# Patient Record
Sex: Female | Born: 1975 | Race: White | Hispanic: No | Marital: Single | Smoking: Current every day smoker
Health system: Southern US, Community
[De-identification: ages and names within clinical notes are randomized; demographics above are authoritative.]

## PROBLEM LIST (undated history)

## (undated) DIAGNOSIS — E0500 Thyrotoxicosis with diffuse goiter without thyrotoxic crisis or storm: Secondary | ICD-10-CM

## (undated) HISTORY — PX: OTHER SURGICAL HISTORY: SHX169

## (undated) HISTORY — PX: EYE SURGERY: SHX253

---

## 2021-06-02 ENCOUNTER — Emergency Department (INDEPENDENT_AMBULATORY_CARE_PROVIDER_SITE_OTHER): Payer: BC Managed Care – PPO

## 2021-06-02 ENCOUNTER — Emergency Department (INDEPENDENT_AMBULATORY_CARE_PROVIDER_SITE_OTHER)
Admission: EM | Admit: 2021-06-02 | Discharge: 2021-06-02 | Disposition: A | Discharge status: Home / Self Care | Payer: BC Managed Care – PPO | Source: Home / Self Care | Attending: Family Medicine | Admitting: Family Medicine

## 2021-06-02 ENCOUNTER — Encounter: Payer: Self-pay | Admitting: Emergency Medicine

## 2021-06-02 ENCOUNTER — Other Ambulatory Visit: Payer: Self-pay

## 2021-06-02 DIAGNOSIS — J40 Bronchitis, not specified as acute or chronic: Secondary | ICD-10-CM

## 2021-06-02 DIAGNOSIS — R0602 Shortness of breath: Secondary | ICD-10-CM

## 2021-06-02 HISTORY — DX: Thyrotoxicosis with diffuse goiter without thyrotoxic crisis or storm: E05.00

## 2021-06-02 MED ORDER — LEVOFLOXACIN 500 MG PO TABS: 500.0000 mg | ORAL_TABLET | Freq: Every day | ORAL | 0 refills | Status: DC

## 2021-06-02 MED ORDER — PREDNISONE 20 MG PO TABS: 20.0000 mg | ORAL_TABLET | Freq: Two times a day (BID) | ORAL | 0 refills | Status: DC

## 2021-06-02 NOTE — Discharge Instructions (Signed)
Take 2 prednisone a day for 5 days Take the antibiotic once a day for 7 days Continue to drink lots of water Run a humidifier in your bedroom if you have 1 available See your doctor if not improving by next week

## 2021-06-02 NOTE — ED Triage Notes (Addendum)
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 Her PCP prescribed prednisone and cough syrup from 1-19, but did not get completely better, now cough is worse, SOB x 4 days HX Pneumonia

## 2021-06-02 NOTE — ED Provider Notes (Signed)
[facility]    CSN: [id number] Arrival date & time: 06/02/21  1237      History   Chief Complaint Chief Complaint  Patient presents with   Shortness of Breath    HPI Olivia Mitchell is a 46 y.o. female.   HPI  Patient's had a cough for about 3 weeks.  She saw her primary care doctor in January.  She was given prednisone and cough medicine.  She continues to cough in spite of this.  She does have a history of spontaneous pneumothorax.  She had partial removal of her left lung 14 years ago.  She continues to smoke cigarettes.  She states that she has some chest pain with coughing, feels short of breath, she has pain with deep breath.  No fever or chills  Past Medical History:  Diagnosis Date   Graves disease     There are no problems to display for this patient.   Past Surgical History:  Procedure Laterality Date   EYE SURGERY     tubaligation      OB History   No obstetric history on file.      Home Medications    Prior to Admission medications   Medication Sig Start Date End Date Taking? Authorizing Provider  clindamycin (CLEOCIN T) 1 % external solution Apply topically 2 (two) times daily.   Yes [provider]  diltiazem (CARDIZEM) 120 MG tablet Take 120 mg by mouth 4 (four) times daily.   Yes [provider]  levofloxacin (LEVAQUIN) 500 MG tablet Take 1 tablet (500 mg total) by mouth daily. 06/02/21  Yes Eustace Moore, MD  levothyroxine (SYNTHROID) 125 MCG tablet Take 125 mcg by mouth daily before breakfast.   Yes [provider]  predniSONE (DELTASONE) 20 MG tablet Take 1 tablet (20 mg total) by mouth 2 (two) times daily with a meal. 06/02/21  Yes Eustace Moore, MD    Family History Family History  Problem Relation Age of Onset   Thyroid disease Mother    Neuropathy Mother    Hypertension Mother    Asthma Father     Social History Social History   Tobacco Use   Smoking status: Every Day    Packs/day:  0.50    Types: Cigarettes   Smokeless tobacco: Never  Vaping Use   Vaping Use: Never used  Substance Use Topics   Alcohol use: Not Currently   Drug use: Not Currently     Allergies   Patient has no known allergies.   Review of Systems Review of Systems See HPI  Physical Exam Triage Vital Signs ED Triage Vitals  Enc Vitals Group     BP 06/02/21 1257 116/79     Pulse Rate 06/02/21 1257 96     Resp 06/02/21 1257 18     Temp 06/02/21 1257 98.2 F (36.8 C)     Temp Source 06/02/21 1257 Oral     SpO2 06/02/21 1257 93 %     Weight 06/02/21 1258 135 lb (61.2 kg)     Height 06/02/21 1258 5\' 6"  (1.676 m)     Head Circumference --      Peak Flow --      Pain Score 06/02/21 1258 0     Pain Loc --      Pain Edu? --      Excl. in GC? --    No data found.  Updated Vital Signs BP 116/79 (BP Location: Left Arm)  Pulse 96    Temp 98.2 F (36.8 C) (Oral)    Resp 18    Ht 5\' 6"  (1.676 m)    Wt 61.2 kg    LMP 05/10/2021 (Exact Date)    SpO2 93%    BMI 21.79 kg/m        Physical Exam Constitutional:      General: She is not in acute distress.    Appearance: She is well-developed and normal weight.  HENT:     Head: Normocephalic and atraumatic.     Right Ear: Tympanic membrane and ear canal normal.     Left Ear: Tympanic membrane and ear canal normal.     Nose: Nose normal.  Eyes:     Conjunctiva/sclera: Conjunctivae normal.     Pupils: Pupils are equal, round, and reactive to light.  Cardiovascular:     Rate and Rhythm: Normal rate and regular rhythm.  Pulmonary:     Effort: Pulmonary effort is normal. No respiratory distress.     Comments: Patient takes shallow breaths.  Coughs with deep breath.  No wheezing rales or rhonchi identified Abdominal:     General: There is no distension.     Palpations: Abdomen is soft.  Musculoskeletal:        General: Normal range of motion.     Cervical back: Normal range of motion.  Lymphadenopathy:     Cervical: No cervical  adenopathy.  Skin:    General: Skin is warm and dry.  Neurological:     Mental Status: She is alert.  Psychiatric:        Mood and Affect: Mood normal.        Behavior: Behavior normal.     UC Treatments / Results  Labs (all labs ordered are listed, but only abnormal results are displayed) Labs Reviewed - No data to display  EKG   Radiology DG Chest 2 View  Result Date: 06/02/2021 CLINICAL DATA:  History of pneumonia EXAM: CHEST - 2 VIEW COMPARISON:  None. FINDINGS: Cardiac and mediastinal contours are within normal limits. Apical pleuroparenchymal scarring. Elevation of the left hemidiaphragm and postsurgical changes of the left lung. Left lower lung linear and nodular opacities. Blunting of the right costophrenic angle which may be due to trace pleural effusion or pleural thickening. No evidence of pneumothorax. IMPRESSION: Left lower lung linear nodular opacities, possibly due to scarring or infection. Recommend CT for better evaluation. Electronically Signed   By: Allegra Lai M.D.   On: 06/02/2021 13:22    Procedures Procedures (including critical care time)  Medications Ordered in UC Medications - No data to display  Initial Impression / Assessment and Plan / UC Course  I have reviewed the triage vital signs and the nursing notes.  Pertinent labs & imaging results that were available during my care of the patient were reviewed by me and considered in my medical decision making The x-ray was reviewed with the patient.  She understands that there are some abnormalities are likely due to her prior lung injury.  No pneumonia is identified.  Told of her symptoms persisted important to get the CAT scan recommended Final Clinical Impressions(s) / UC Diagnoses   Final diagnoses:  Shortness of breath  Bronchitis     Discharge Instructions      Take 2 prednisone a day for 5 days Take the antibiotic once a day for 7 days Continue to drink lots of water Run a humidifier  in your bedroom if you have 1  available See your doctor if not improving by next week   ED Prescriptions     Medication Sig Dispense Auth. Provider   levofloxacin (LEVAQUIN) 500 MG tablet Take 1 tablet (500 mg total) by mouth daily. 7 tablet Eustace Moore, MD   predniSONE (DELTASONE) 20 MG tablet Take 1 tablet (20 mg total) by mouth 2 (two) times daily with a meal. 10 tablet Eustace Moore, MD      PDMP not reviewed this encounter.   Eustace Moore, MD 06/02/21 [phone]

## 2021-06-29 ENCOUNTER — Encounter (HOSPITAL_COMMUNITY): Payer: Self-pay | Admitting: Radiology

## 2021-08-07 ENCOUNTER — Emergency Department (INDEPENDENT_AMBULATORY_CARE_PROVIDER_SITE_OTHER): Payer: BC Managed Care – PPO

## 2021-08-07 ENCOUNTER — Emergency Department (INDEPENDENT_AMBULATORY_CARE_PROVIDER_SITE_OTHER)
Admission: EM | Admit: 2021-08-07 | Discharge: 2021-08-07 | Disposition: A | Discharge status: Home / Self Care | Payer: BC Managed Care – PPO | Source: Home / Self Care | Attending: Family Medicine | Admitting: Family Medicine

## 2021-08-07 ENCOUNTER — Encounter: Payer: Self-pay | Admitting: Emergency Medicine

## 2021-08-07 DIAGNOSIS — M5442 Lumbago with sciatica, left side: Secondary | ICD-10-CM

## 2021-08-07 DIAGNOSIS — M545 Low back pain, unspecified: Secondary | ICD-10-CM

## 2021-08-07 DIAGNOSIS — S39012A Strain of muscle, fascia and tendon of lower back, initial encounter: Secondary | ICD-10-CM | POA: Diagnosis not present

## 2021-08-07 DIAGNOSIS — M5416 Radiculopathy, lumbar region: Secondary | ICD-10-CM

## 2021-08-07 MED ORDER — CYCLOBENZAPRINE HCL 5 MG PO TABS: 5.0000 mg | ORAL_TABLET | Freq: Three times a day (TID) | ORAL | 0 refills | Status: DC | PRN

## 2021-08-07 MED ORDER — METHYLPREDNISOLONE 4 MG PO TBPK: ORAL_TABLET | ORAL | 0 refills | Status: DC

## 2021-08-07 MED ORDER — HYDROCODONE-ACETAMINOPHEN 7.5-325 MG PO TABS: 1.0000 | ORAL_TABLET | Freq: Four times a day (QID) | ORAL | 0 refills | Status: DC | PRN

## 2021-08-07 MED ORDER — KETOROLAC TROMETHAMINE 60 MG/2ML IM SOLN
60.0000 mg | Freq: Once | INTRAMUSCULAR | Status: AC
  Administered 2021-08-07: 60 mg via INTRAMUSCULAR

## 2021-08-07 MED ORDER — ACETAMINOPHEN 500 MG PO TABS
1000.0000 mg | ORAL_TABLET | Freq: Once | ORAL | Status: AC
  Administered 2021-08-07: 1000 mg via ORAL

## 2021-08-07 NOTE — ED Triage Notes (Addendum)
Patient presents to Urgent Care with complaints of right back pain radiating down right leg since 3 days ago. Patient reports the pain is severe. Compared to four kids this is bad. Tried Ibuprofen. Has been able to walk but slow. Having shooting pain that comes and goes and constant dull pain. No history of back pain previous ?Marland Kitchen   ?

## 2021-08-07 NOTE — ED Notes (Signed)
Pt's pharmacy in [place] does not have Cobb - All scripts changed to [facility] in [place] by Dr Meda Coffee. Pt to call back tomorrow if she is not able to get Norco filled  ?

## 2021-08-07 NOTE — Discharge Instructions (Addendum)
X-rays do not show any significant degenerative disc disease or alignment problem ?You do have symptoms consistent with a nerve inflammation (left leg pain) ?This is treated with steroids.  I am giving you a Dosepak.  Take all of day 1 today ?In addition I am giving you pain medication and muscle relaxers to take as needed ?Activity as tolerated.  Try to reduce your activity to avoid bending and lifting movements ?See your doctor if not improving by next week ?

## 2021-08-07 NOTE — ED Provider Notes (Addendum)
?[facility] ? ? ? ?CSN: [id number] ?Arrival date & time: 08/07/21  1424 ? ? ?  ? ?History   ?Chief Complaint ?Chief Complaint  ?Patient presents with  ? Back Pain  ? ? ?HPI ?Olivia Mitchell is a 46 y.o. female.  ? ?HPI ? ?Patient is here for back pain.  She states she is never had back pain this severe.  She has pain in her right low back that radiates down through her buttock to the shin of her right leg.  It is worse with movement.  She states that she has tried to continue with her normal activity by taking over-the-counter medications and being stubborn.  Today she states the pain is so severe she can hardly move. ?She does have a history of back pain in the past but she states that it was different.  She was seen by Dr. Laurian Brim'Toole in the spine department for injections and pain management.  She had pain in the right low back.  Her lumbar spine films at that time were negative.  Uncertain if she had imaging.  Chart reviewed ?Is not currently under treatment for any back or orthopedic problems ? ?Past Medical History:  ?Diagnosis Date  ? Graves disease   ? ? ?There are no problems to display for this patient. ? ? ?Past Surgical History:  ?Procedure Laterality Date  ? EYE SURGERY    ? tubaligation    ? ? ?OB History   ?No obstetric history on file. ?  ? ? ? ?Home Medications   ? ?Prior to Admission medications   ?Medication Sig Start Date End Date Taking? Authorizing Provider  ?diltiazem (CARDIZEM) 120 MG tablet Take 120 mg by mouth 4 (four) times daily.   Yes [provider]  ?levothyroxine (SYNTHROID) 125 MCG tablet Take 125 mcg by mouth daily before breakfast.   Yes [provider]  ?clindamycin (CLEOCIN T) 1 % external solution Apply topically 2 (two) times daily.    [provider]  ?cyclobenzaprine (FLEXERIL) 5 MG tablet Take 1-2 tablets (5-10 mg total) by mouth 3 (three) times daily as needed for muscle spasms. CAUTION DROWSINESS 08/07/21   Eustace MooreNelson, Bhakti Labella Sue, MD   ?HYDROcodone-acetaminophen [facility](NORCO) 7.5-325 MG tablet Take 1 tablet by mouth every 6 (six) hours as needed for moderate pain. 08/07/21   Eustace MooreNelson, Xariah Silvernail Sue, MD  ?methylPREDNISolone (MEDROL DOSEPAK) 4 MG TBPK tablet tad 08/07/21   Eustace MooreNelson, Allure Greaser Sue, MD  ? ? ?Family History ?Family History  ?Problem Relation Age of Onset  ? Thyroid disease Mother   ? Neuropathy Mother   ? Hypertension Mother   ? Asthma Father   ? ? ?Social History ?Social History  ? ?Tobacco Use  ? Smoking status: Every Day  ?  Packs/day: 0.50  ?  Types: Cigarettes  ? Smokeless tobacco: Never  ?Vaping Use  ? Vaping Use: Never used  ?Substance Use Topics  ? Alcohol use: Not Currently  ? Drug use: Not Currently  ? ? ? ?Allergies   ?Patient has no known allergies. ? ? ?Review of Systems ?Review of Systems ?See HPI ? ?Physical Exam ?Triage Vital Signs ?ED Triage Vitals  ?Enc Vitals Group  ?   BP 08/07/21 1440 102/73  ?   Pulse Rate 08/07/21 1440 (!) 107  ?   Resp 08/07/21 1440 20  ?   Temp 08/07/21 1440 97.8 ?F (36.6 ?C)  ?   Temp Source 08/07/21 1440 Oral  ?   SpO2 08/07/21 1440 98 %  ?  Weight --   ?   Height --   ?   Head Circumference --   ?   Peak Flow --   ?   Pain Score 08/07/21 1438 10  ?   Pain Loc --   ?   Pain Edu? --   ?   Excl. in GC? --   ? ?No data found. ? ?Updated Vital Signs ?BP 102/73 (BP Location: Right Arm)   Pulse (!) 107   Temp 97.8 ?F (36.6 ?C) (Oral)   Resp 20   SpO2 98%  ?   ? ?Physical Exam ?Constitutional:   ?   General: She is in acute distress.  ?   Appearance: She is well-developed and normal weight. She is ill-appearing.  ?   Comments: Acutely uncomfortable.  Holds onto the wall when she looks down the hallway to her room.  As I enter the room she is standing and bent over with hands on the table.  Resist putting full weight on the right leg.  States is unable to get on the exam table for further examination.  Right leg has good strength.  Appears to have normal sensation.  Full examination is challenging at this time.   We will give her a shot of Toradol and reexamine  ?HENT:  ?   Head: Normocephalic and atraumatic.  ?Eyes:  ?   Conjunctiva/sclera: Conjunctivae normal.  ?   Pupils: Pupils are equal, round, and reactive to light.  ?Cardiovascular:  ?   Rate and Rhythm: Normal rate.  ?Pulmonary:  ?   Effort: Pulmonary effort is normal. No respiratory distress.  ?Abdominal:  ?   General: There is no distension.  ?   Palpations: Abdomen is soft.  ?Musculoskeletal:     ?   General: Tenderness present. Normal range of motion.  ?   Cervical back: Normal range of motion.  ?Skin: ?   General: Skin is warm and dry.  ?Neurological:  ?   General: No focal deficit present.  ?   Mental Status: She is alert.  ?   Motor: No weakness.  ?   Gait: Gait abnormal.  ?Psychiatric:     ?   Mood and Affect: Mood normal.     ?   Behavior: Behavior normal.  ? ?Limited range of motion of lumbar spine.  Straight leg raise on the left is positive at 30 degrees.  Reflexes are 2+ and equal at the knee and ankle.  Sensory exam and muscular exam are symmetric ? ?UC Treatments / Results  ?Labs ?(all labs ordered are listed, but only abnormal results are displayed) ?Labs Reviewed - No data to display ? ?EKG ? ? ?Radiology ?DG Lumbar Spine Complete ? ?Result Date: 08/07/2021 ?CLINICAL DATA:  Back pain EXAM: LUMBAR SPINE - COMPLETE 4+ VIEW COMPARISON:  None. FINDINGS: There is no evidence of lumbar spine fracture. Alignment is normal. Intervertebral disc spaces are maintained. Mild facet arthropathy in the lower lumbar spine. IMPRESSION: No acute osseous abnormality identified. Electronically Signed   By: Jannifer Hick M.D.   On: 08/07/2021 15:52   ? ?Procedures ?Procedures (including critical care time) ? ?Medications Ordered in UC ?Medications  ?ketorolac (TORADOL) injection 60 mg (60 mg Intramuscular Given 08/07/21 1500)  ?acetaminophen (TYLENOL) tablet 1,000 mg (1,000 mg Oral Given 08/07/21 1502)  ? ? ?Initial Impression / Assessment and Plan / UC Course  ?I have  reviewed the triage vital signs and the nursing notes. ? ?Pertinent labs & imaging results  that were available during my care of the patient were reviewed by me and considered in my medical decision making (see chart for details). ? ?  ? ?Discussed with patient x-rays are negative.  She does have nerve root inflammation.  I recommend steroids for this.  Pain medicine muscle relaxer for comfort.  Modified activity.  Lumbar strain information given. ?Final Clinical Impressions(s) / UC Diagnoses  ? ?Final diagnoses:  ?Strain of lumbar region, initial encounter  ?Acute back pain with sciatica, left  ?Lumbar radiculopathy  ? ? ? ?Discharge Instructions   ? ?  ?X-rays do not show any significant degenerative disc disease or alignment problem ?You do have symptoms consistent with a nerve inflammation (left leg pain) ?This is treated with steroids.  I am giving you a Dosepak.  Take all of day 1 today ?In addition I am giving you pain medication and muscle relaxers to take as needed ?Activity as tolerated.  Try to reduce your activity to avoid bending and lifting movements ?See your doctor if not improving by next week ? ? ? ? ?ED Prescriptions   ? ? Medication Sig Dispense Auth. Provider  ? HYDROcodone-acetaminophen (NORCO) 7.5-325 MG tablet Take 1 tablet by mouth every 6 (six) hours as needed for moderate pain. 15 tablet Eustace Moore, MD  ? cyclobenzaprine (FLEXERIL) 5 MG tablet Take 1-2 tablets (5-10 mg total) by mouth 3 (three) times daily as needed for muscle spasms. CAUTION DROWSINESS 30 tablet Eustace Moore, MD  ? methylPREDNISolone (MEDROL DOSEPAK) 4 MG TBPK tablet tad 21 tablet Eustace Moore, MD  ? ?  ? ?I have reviewed the PDMP during this encounter. ?  ?Eustace Moore, MD ?08/07/21 1558 ? ?  ?Eustace Moore, MD ?08/07/21 1559 ? ?

## 2024-01-08 IMAGING — DX DG LUMBAR SPINE COMPLETE 4+V
5 series · 5 of 5 positions shown · non-contrast
Comparison: None.

CLINICAL DATA: Back pain

EXAM:
LUMBAR SPINE - COMPLETE 4+ VIEW

[l-spine ap]
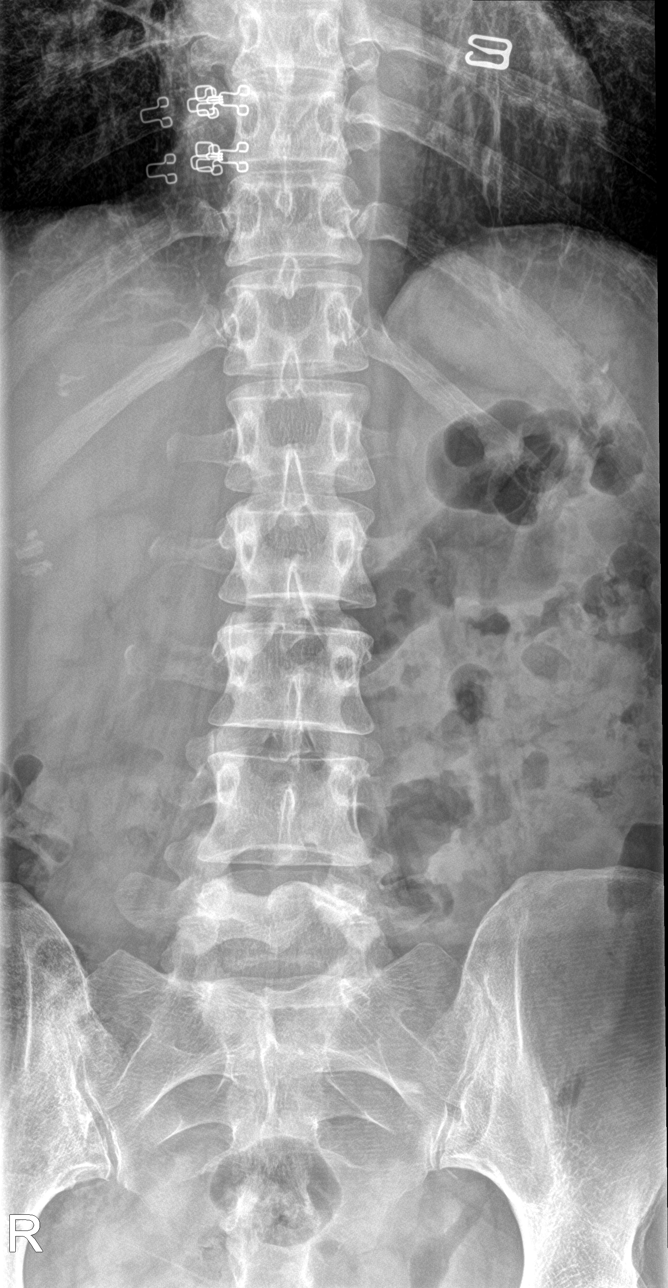

[l-spine obl (1 of 2)]
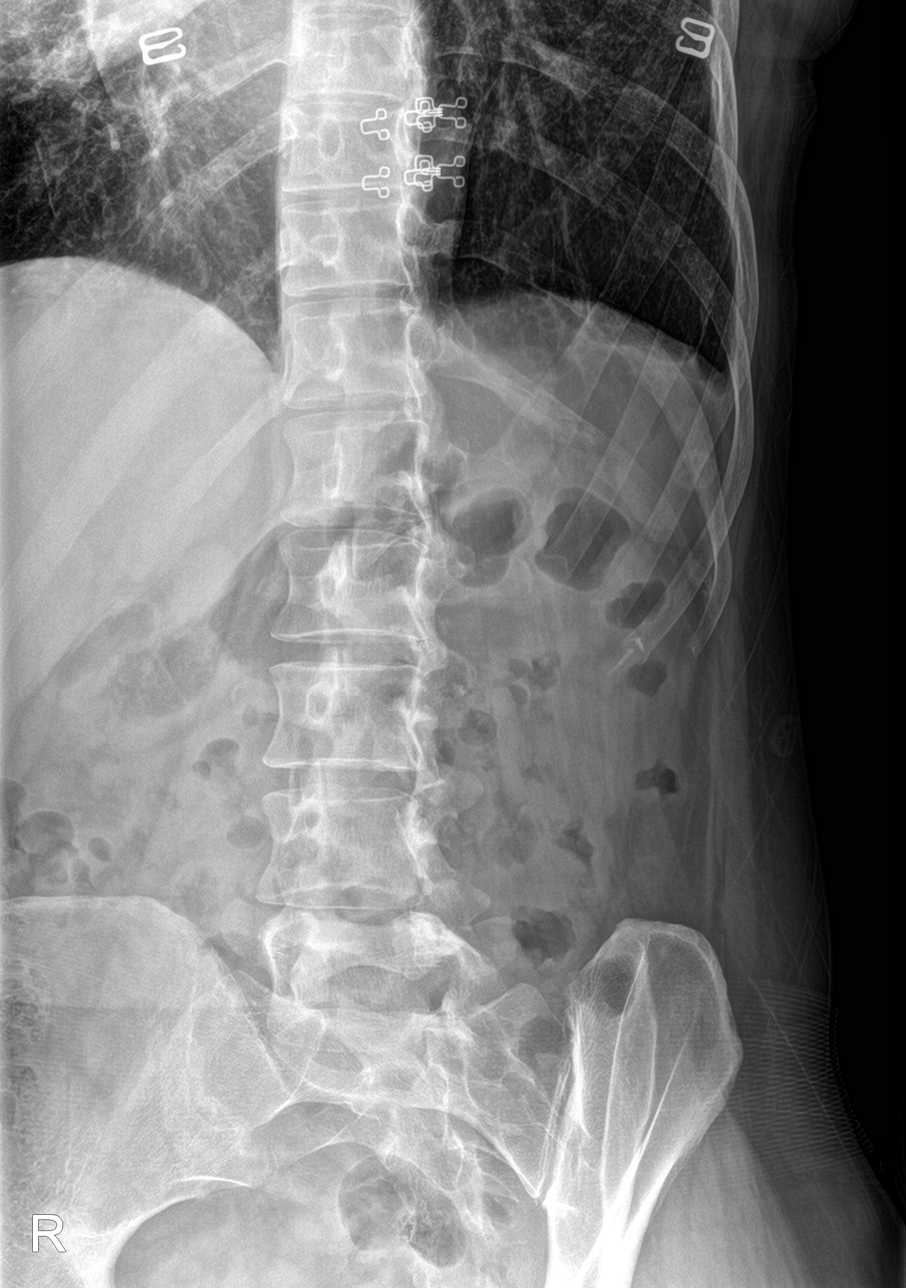

[l-spine obl (2 of 2)]
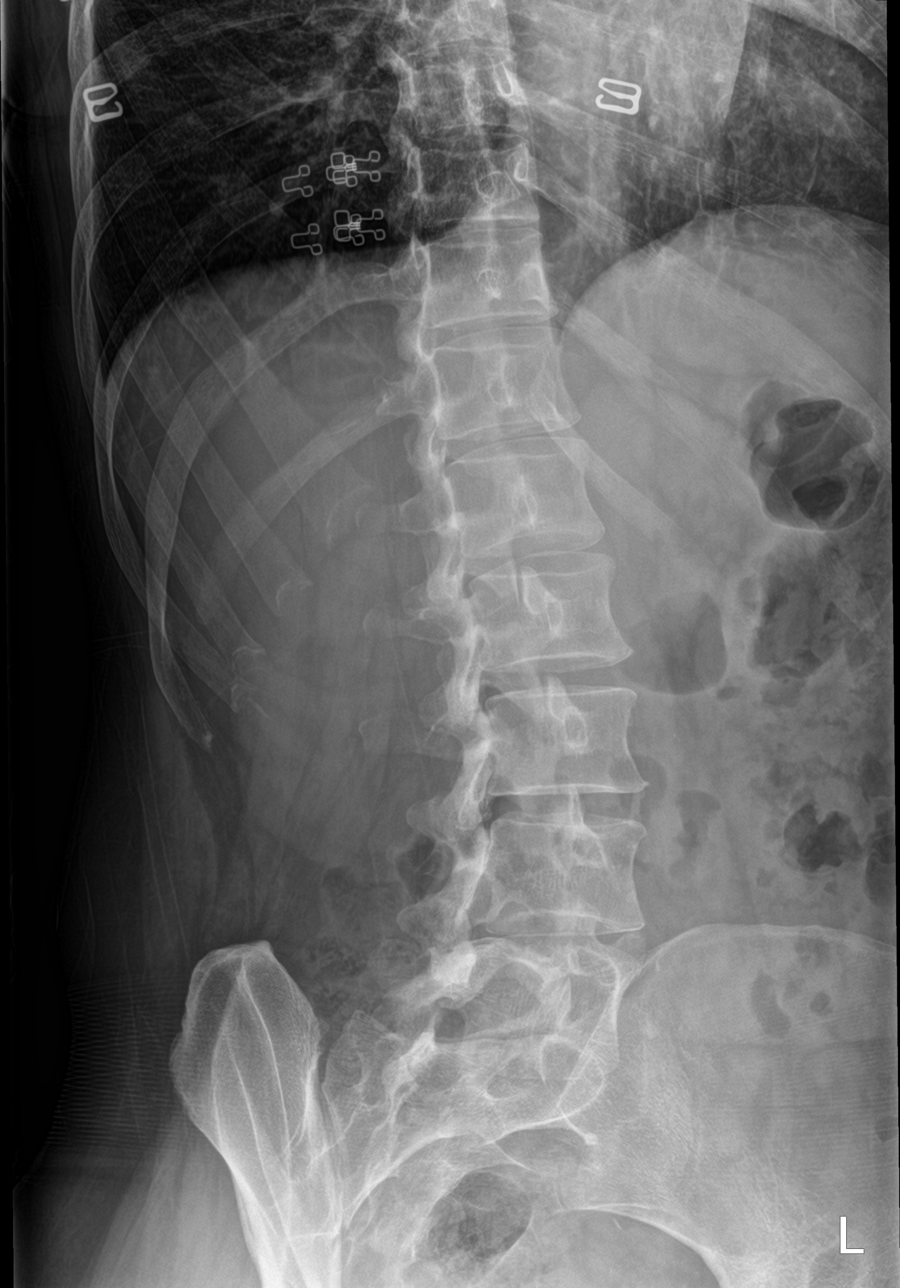

[l-spine lat]
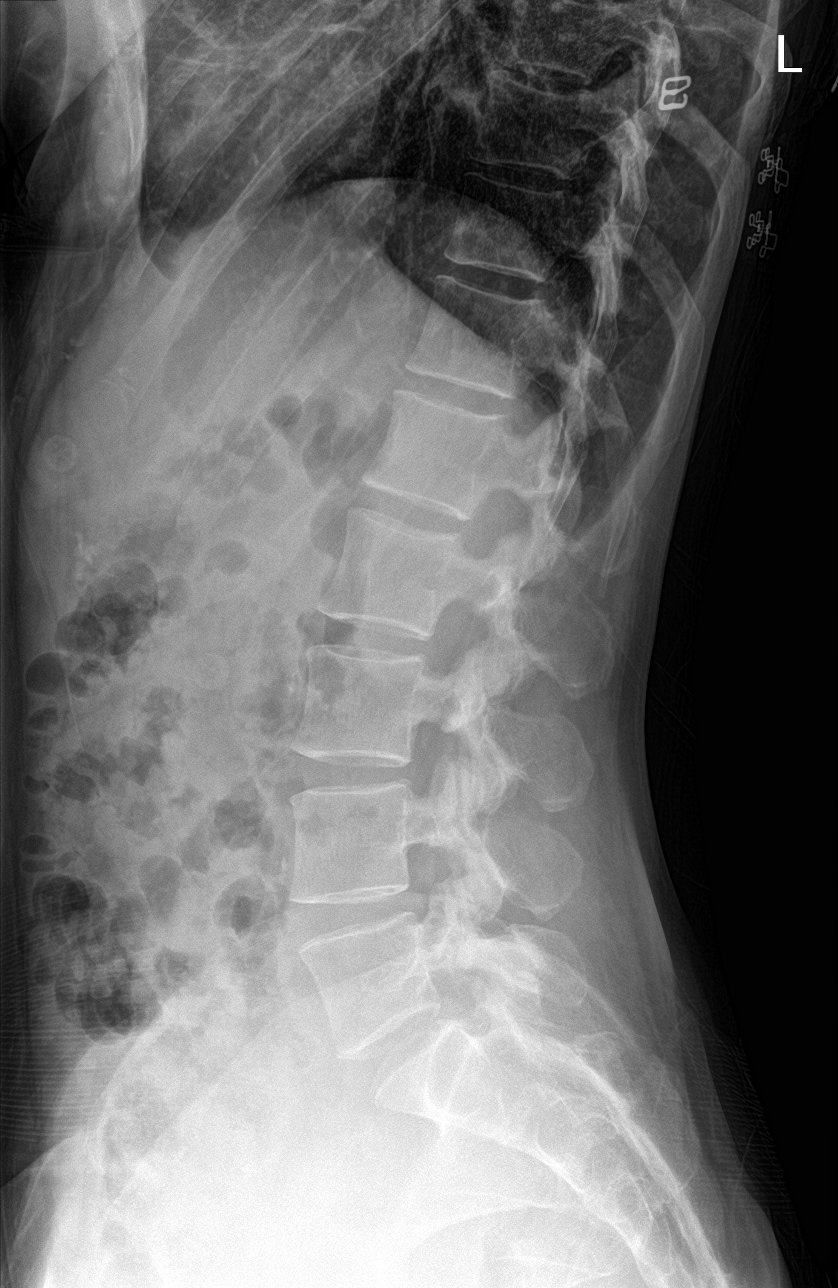

[l-spine spot]
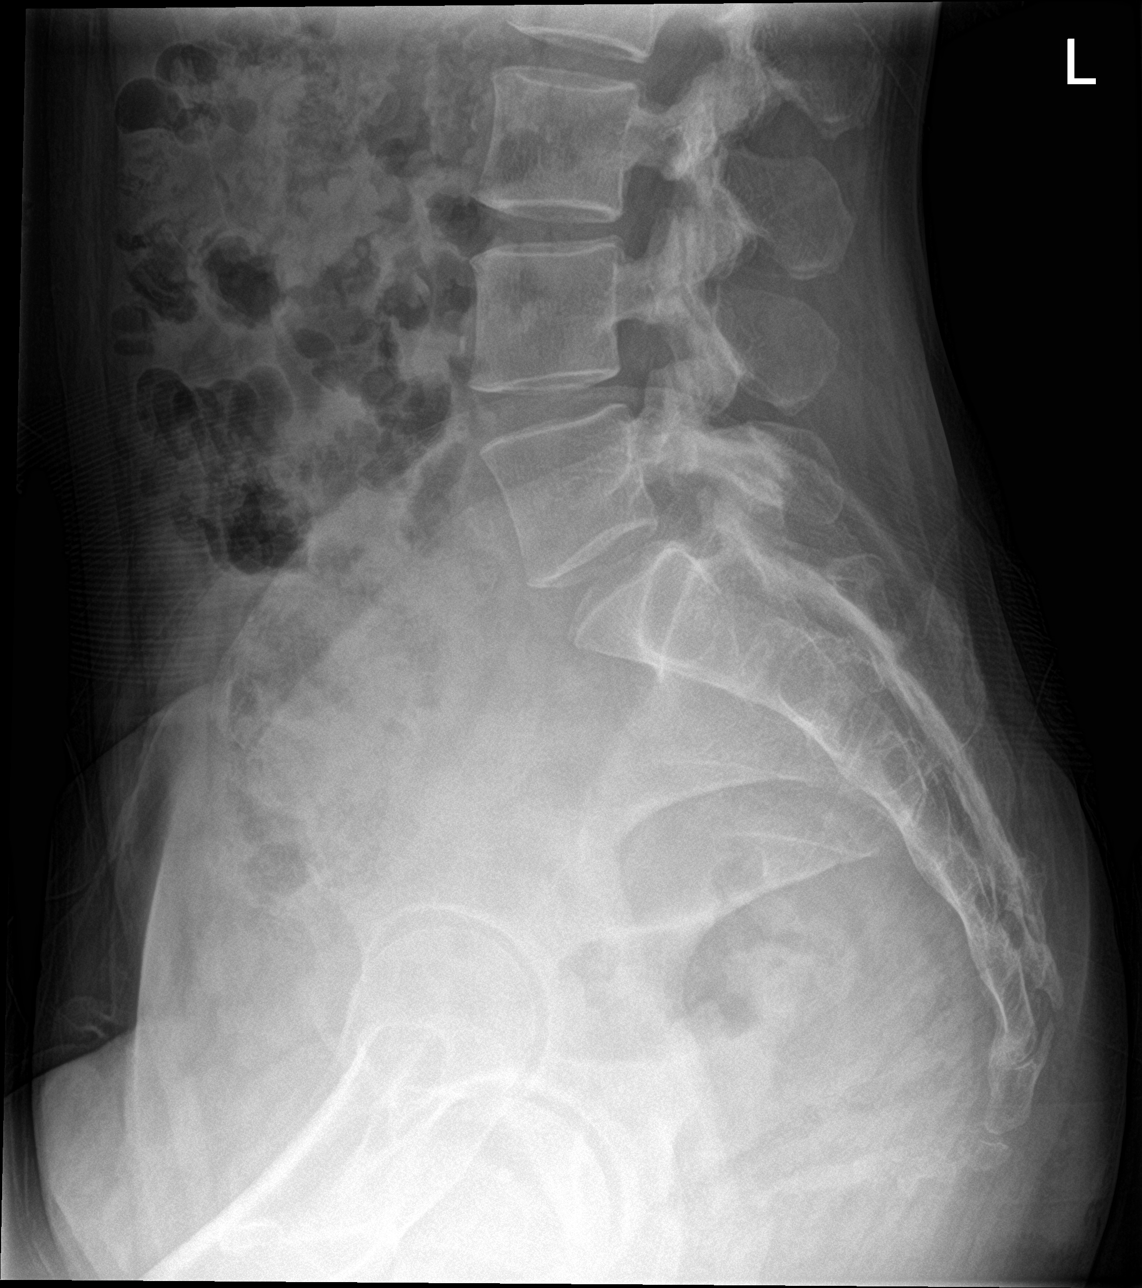

[5 of 5 positions shown; findings below may reference images not displayed]

FINDINGS: There is no evidence of lumbar spine fracture. Alignment is normal.
Intervertebral disc spaces are maintained. Mild facet arthropathy in
the lower lumbar spine.
IMPRESSION: No acute osseous abnormality identified.

## 2024-01-09 ENCOUNTER — Ambulatory Visit
Admission: EM | Admit: 2024-01-09 | Discharge: 2024-01-09 | Disposition: A | Discharge status: Home / Self Care | Attending: Family Medicine | Admitting: Family Medicine

## 2024-01-09 ENCOUNTER — Encounter: Payer: Self-pay | Admitting: Emergency Medicine

## 2024-01-09 DIAGNOSIS — M898X9 Other specified disorders of bone, unspecified site: Secondary | ICD-10-CM

## 2024-01-09 DIAGNOSIS — R5383 Other fatigue: Secondary | ICD-10-CM | POA: Diagnosis not present

## 2024-01-09 NOTE — ED Triage Notes (Signed)
 Patient c/o fatigue, generally not feeling well, cough and congestion x 2 weeks.  Patient has taken Chloracidin.  Doesn't feel sick just feels tired.  Unable to get in with PCP for another week.

## 2024-01-09 NOTE — Discharge Instructions (Signed)
 Your lab test results will be available on MyChart You will be called if any test is positive Follow-up with your primary care next week

## 2024-01-09 NOTE — ED Provider Notes (Signed)
 [facility]    CSN: [id number] Arrival date & time: 01/09/24  9060      History   Chief Complaint Chief Complaint  Patient presents with   Fatigue    HPI Olivia Mitchell is a 48 y.o. female.   HPI  Patient is here for fatigue.  She states that she feels like something is wrong with her.  She has a slight cough and congestion.  She is a smoker.  She is on thyroid replacement after treatment for Graves' disease.  Is on Cardizem for her blood pressure.  She was on Chantix trying to quit smoking, but had to quit this 8 days ago because she was feeling so bad.  She states she is so tired she can hardly walk across the room.  She states her legs feel heavy and sore when she tries to walk.  She is exhausted.  She states she is eating well.  She is sleeping well.  The cough does not keep her awake at night and is a minor complaint.  She states she is still menstruating and had 1 heavy cycle last month, but usually is pretty regular.  Past Medical History:  Diagnosis Date   Graves disease     There are no active problems to display for this patient.   Past Surgical History:  Procedure Laterality Date   EYE SURGERY     tubaligation      OB History   No obstetric history on file.      Home Medications    Prior to Admission medications   Medication Sig Start Date End Date Taking? Authorizing Provider  diltiazem (CARDIZEM) 120 MG tablet Take 120 mg by mouth 4 (four) times daily.   Yes [provider]  levothyroxine (SYNTHROID) 125 MCG tablet Take 125 mcg by mouth daily before breakfast.   Yes [provider]    Family History Family History  Problem Relation Age of Onset   Thyroid disease Mother    Neuropathy Mother    Hypertension Mother    Asthma Father     Social History Social History   Tobacco Use   Smoking status: Every Day    Current packs/day: 0.50    Types: Cigarettes   Smokeless tobacco: Never  Vaping Use   Vaping status:  Never Used  Substance Use Topics   Alcohol use: Not Currently   Drug use: Not Currently     Allergies   Patient has no known allergies.   Review of Systems Review of Systems See HPI  Physical Exam Triage Vital Signs ED Triage Vitals  Encounter Vitals Group     BP 01/09/24 0947 110/73     Girls Systolic BP Percentile --      Girls Diastolic BP Percentile --      Boys Systolic BP Percentile --      Boys Diastolic BP Percentile --      Pulse Rate 01/09/24 0947 86     Resp 01/09/24 0947 18     Temp 01/09/24 0947 98.1 F (36.7 C)     Temp Source 01/09/24 0947 Oral     SpO2 01/09/24 0947 97 %     Weight 01/09/24 0949 132 lb (59.9 kg)     Height 01/09/24 0949 5' 6 (1.676 m)     Head Circumference --      Peak Flow --      Pain Score 01/09/24 0949 0     Pain Loc --  Pain Education --      Exclude from Growth Chart --    No data found.  Updated Vital Signs BP 110/73 (BP Location: Right Arm)   Pulse 86   Temp 98.1 F (36.7 C) (Oral)   Resp 18   Ht 5' 6 (1.676 m)   Wt 59.9 kg   LMP 12/31/2023   SpO2 97%   BMI 21.31 kg/m     Physical Exam Constitutional:      General: She is not in acute distress.    Appearance: She is well-developed.  HENT:     Head: Normocephalic and atraumatic.     Right Ear: Tympanic membrane normal.     Left Ear: Tympanic membrane normal.     Nose: No congestion.     Mouth/Throat:     Pharynx: No posterior oropharyngeal erythema.  Eyes:     Conjunctiva/sclera: Conjunctivae normal.     Pupils: Pupils are equal, round, and reactive to light.     Comments: Exophthalmos  Cardiovascular:     Rate and Rhythm: Normal rate.     Heart sounds: Normal heart sounds.  Pulmonary:     Effort: Pulmonary effort is normal. No respiratory distress.     Breath sounds: Normal breath sounds.  Abdominal:     General: There is no distension.     Palpations: Abdomen is soft.  Musculoskeletal:        General: Normal range of motion.     Cervical  back: Normal range of motion.  Skin:    General: Skin is warm and dry.  Neurological:     General: No focal deficit present.     Mental Status: She is alert.     Deep Tendon Reflexes: Reflexes normal.      UC Treatments / Results  Labs (all labs ordered are listed, but only abnormal results are displayed) Labs Reviewed  COMPREHENSIVE METABOLIC PANEL WITH GFR  CBC WITH DIFFERENTIAL/PLATELET  TSH  VITAMIN D  25 HYDROXY (VIT D DEFICIENCY, FRACTURES)    EKG   Radiology No results found.  Procedures Procedures (including critical care time)  Medications Ordered in UC Medications - No data to display  Initial Impression / Assessment and Plan / UC Course  I have reviewed the triage vital signs and the nursing notes.  Pertinent labs & imaging results that were available during my care of the patient were reviewed by me and considered in my medical decision making (see chart for details).     I explained to the patient the difficulty in working up a vague complaint like fatigue at an urgent care center.  Barring an obvious diagnosis like viral illness or pneumonia, although I can do his blood work to get started on a workup to make sure she does not have anemia, hypothyroidism, or vitamin deficiency.  Patient agrees.  Will see her PCP next week. Final Clinical Impressions(s) / UC Diagnoses   Final diagnoses:  Fatigue, unspecified type  Bone pain     Discharge Instructions      Your lab test results will be available on MyChart You will be called if any test is positive Follow-up with your primary care next week     ED Prescriptions   None    PDMP not reviewed this encounter.   Maranda Jamee Jacob, MD 01/09/24 [phone]

## 2024-01-10 ENCOUNTER — Ambulatory Visit: Payer: Self-pay | Admitting: Family Medicine

## 2024-01-10 LAB — CBC WITH DIFFERENTIAL/PLATELET
Basophils Absolute: 0 x10E3/uL (ref 0.0–0.2)
Basos: 0 %
EOS (ABSOLUTE): 0 x10E3/uL (ref 0.0–0.4)
Eos: 0 %
Hematocrit: 44.7 % (ref 34.0–46.6)
Hemoglobin: 14.6 g/dL (ref 11.1–15.9)
Immature Grans (Abs): 0 x10E3/uL (ref 0.0–0.1)
Immature Granulocytes: 0 %
Lymphocytes Absolute: 2.3 x10E3/uL (ref 0.7–3.1)
Lymphs: 24 %
MCH: 30 pg (ref 26.6–33.0)
MCHC: 32.7 g/dL (ref 31.5–35.7)
MCV: 92 fL (ref 79–97)
Monocytes Absolute: 0.5 x10E3/uL (ref 0.1–0.9)
Monocytes: 6 %
Neutrophils Absolute: 6.7 x10E3/uL (ref 1.4–7.0)
Neutrophils: 70 %
Platelets: 229 x10E3/uL (ref 150–450)
RBC: 4.87 x10E6/uL (ref 3.77–5.28)
RDW: 14.5 % (ref 11.7–15.4)
WBC: 9.5 x10E3/uL (ref 3.4–10.8)

## 2024-01-10 LAB — COMPREHENSIVE METABOLIC PANEL WITH GFR
ALT: 11 IU/L (ref 0–32)
AST: 13 IU/L (ref 0–40)
Albumin: 4.6 g/dL (ref 3.9–4.9)
Alkaline Phosphatase: 84 IU/L (ref 41–116)
BUN/Creatinine Ratio: 16 (ref 9–23)
BUN: 9 mg/dL (ref 6–24)
Bilirubin Total: 0.3 mg/dL (ref 0.0–1.2)
CO2: 23 mmol/L (ref 20–29)
Calcium: 9.5 mg/dL (ref 8.7–10.2)
Chloride: 103 mmol/L (ref 96–106)
Creatinine, Ser: 0.58 mg/dL (ref 0.57–1.00)
Globulin, Total: 2.9 g/dL (ref 1.5–4.5)
Glucose: 93 mg/dL (ref 70–99)
Potassium: 4.4 mmol/L (ref 3.5–5.2)
Sodium: 139 mmol/L (ref 134–144)
Total Protein: 7.5 g/dL (ref 6.0–8.5)
eGFR: 114 mL/min/1.73 (ref >59)

## 2024-01-10 LAB — VITAMIN D 25 HYDROXY (VIT D DEFICIENCY, FRACTURES): Vit D, 25-Hydroxy: 29.1 ng/mL — ABNORMAL LOW (ref 30.0–100.0)

## 2024-01-10 LAB — TSH: TSH: 0.55 u[IU]/mL (ref 0.450–4.500)
